# Patient Record
Sex: Female | Born: 2000 | Race: White | Hispanic: No | Marital: Single | Smoking: Never smoker
Health system: Southern US, Community
[De-identification: ages and names within clinical notes are randomized; demographics above are authoritative.]

## PROBLEM LIST (undated history)

## (undated) HISTORY — PX: TONSILLECTOMY: SUR1361

---
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
## 2021-05-21 ENCOUNTER — Ambulatory Visit: Payer: Medicaid Other | Admitting: Physician Assistant

## 2021-05-23 ENCOUNTER — Ambulatory Visit (INDEPENDENT_AMBULATORY_CARE_PROVIDER_SITE_OTHER): Payer: Medicaid Other | Admitting: Physician Assistant

## 2021-05-23 ENCOUNTER — Encounter: Payer: Self-pay | Admitting: Physician Assistant

## 2021-05-23 ENCOUNTER — Ambulatory Visit (INDEPENDENT_AMBULATORY_CARE_PROVIDER_SITE_OTHER): Payer: Medicaid Other

## 2021-05-23 ENCOUNTER — Other Ambulatory Visit: Payer: Self-pay

## 2021-05-23 DIAGNOSIS — M25531 Pain in right wrist: Secondary | ICD-10-CM | POA: Diagnosis not present

## 2021-05-23 DIAGNOSIS — M542 Cervicalgia: Secondary | ICD-10-CM

## 2021-05-23 DIAGNOSIS — M7541 Impingement syndrome of right shoulder: Secondary | ICD-10-CM

## 2021-05-23 MED ORDER — LIDOCAINE HCL 1 % IJ SOLN
3.0000 mL | INTRAMUSCULAR | Status: AC | PRN
  Administered 2021-05-23: 3 mL

## 2021-05-23 MED ORDER — METHYLPREDNISOLONE ACETATE 40 MG/ML IJ SUSP
40.0000 mg | INTRAMUSCULAR | Status: AC | PRN
  Administered 2021-05-23: 40 mg via INTRA_ARTICULAR
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
## 2021-06-20 ENCOUNTER — Ambulatory Visit: Payer: Medicaid Other | Admitting: Physician Assistant

## 2021-07-04 ENCOUNTER — Ambulatory Visit: Payer: Medicaid Other | Admitting: Physician Assistant

## 2021-07-06 ENCOUNTER — Encounter: Payer: Self-pay | Admitting: Physician Assistant

## 2021-07-06 ENCOUNTER — Ambulatory Visit (INDEPENDENT_AMBULATORY_CARE_PROVIDER_SITE_OTHER): Payer: Medicaid Other

## 2021-07-06 ENCOUNTER — Ambulatory Visit (INDEPENDENT_AMBULATORY_CARE_PROVIDER_SITE_OTHER): Payer: Medicaid Other | Admitting: Physician Assistant

## 2021-07-06 ENCOUNTER — Other Ambulatory Visit: Payer: Self-pay

## 2021-07-06 DIAGNOSIS — M7541 Impingement syndrome of right shoulder: Secondary | ICD-10-CM

## 2021-07-06 NOTE — Progress Notes (Signed)
HPI: Gloria Koch returns today for follow-up of her right shoulder.  She underwent a right shoulder subacromial injection 05/23/2021.  She states that this overall helped with her shoulder pain.  Rates her pain to be 50% improved.  Still has some pain with the shoulder with overhead motion.  She has had no new injuries.  She notes that the pain at the base of her right thumb and swelling has resolved.  Review of systems denies any fevers, chills or radicular symptoms down the right arm.  Physical exam: General well-developed well-nourished female no acute distress mood and affect appropriate. Right shoulder: She has full active and passive forward flexion.  No weakness with external or internal rotation of either shoulder against resistance.  Has no weakness with empty can test on the right but still has pain.  Slightly positive impingement testing right shoulder negative on the left.  Radiographs: 3 views right shoulder no acute fractures.  Shoulders well located.  Glenohumeral joints well-maintained.  Normal bone density.  No other bony abnormalities.  Impression: Right shoulder impingement  Plan: We will send her to formal physical therapy to work on range of motion of the right shoulder, strengthening, include modalities and a home exercise program.  We will see her back in 4 weeks to see how she is doing overall.  She can call and cancel the appointment if her pain is resolved.  Questions were encouraged and answered at length today.

## 2021-07-09 NOTE — Addendum Note (Signed)
Addended by: Barbette Or on: 07/09/2021 08:53 AM   Modules accepted: Orders

## 2021-08-02 ENCOUNTER — Ambulatory Visit: Payer: Medicaid Other | Admitting: Orthopaedic Surgery

## 2024-12-24 ENCOUNTER — Other Ambulatory Visit: Payer: Self-pay

## 2024-12-24 DIAGNOSIS — N949 Unspecified condition associated with female genital organs and menstrual cycle: Secondary | ICD-10-CM
